# Patient Record
Sex: Female | Born: 1935 | Race: White | Hispanic: No | Marital: Married | Smoking: Never smoker
Health system: Southern US, Community
[De-identification: ages and names within clinical notes are randomized; demographics above are authoritative.]

## PROBLEM LIST (undated history)

## (undated) DIAGNOSIS — H3530 Unspecified macular degeneration: Secondary | ICD-10-CM

## (undated) DIAGNOSIS — I10 Essential (primary) hypertension: Secondary | ICD-10-CM

## (undated) HISTORY — PX: APPENDECTOMY: SHX54

## (undated) HISTORY — PX: BACK SURGERY: SHX140

## (undated) HISTORY — PX: TONSILLECTOMY: SUR1361

---

## 1997-07-21 ENCOUNTER — Other Ambulatory Visit: Admission: RE | Admit: 1997-07-21 | Discharge: 1997-07-21 | Payer: Self-pay | Admitting: Obstetrics and Gynecology

## 1998-02-09 ENCOUNTER — Other Ambulatory Visit: Admission: RE | Admit: 1998-02-09 | Discharge: 1998-02-09 | Payer: Self-pay | Admitting: Obstetrics and Gynecology

## 1998-10-13 ENCOUNTER — Other Ambulatory Visit: Admission: RE | Admit: 1998-10-13 | Discharge: 1998-10-13 | Payer: Self-pay | Admitting: Obstetrics & Gynecology

## 1999-08-17 ENCOUNTER — Encounter: Payer: Self-pay | Admitting: Orthopedic Surgery

## 1999-08-17 ENCOUNTER — Encounter: Admission: RE | Admit: 1999-08-17 | Discharge: 1999-08-17 | Payer: Self-pay | Admitting: Orthopedic Surgery

## 1999-10-17 ENCOUNTER — Encounter: Payer: Self-pay | Admitting: Orthopedic Surgery

## 1999-10-17 ENCOUNTER — Encounter: Admission: RE | Admit: 1999-10-17 | Discharge: 1999-10-17 | Payer: Self-pay | Admitting: Orthopedic Surgery

## 1999-10-18 ENCOUNTER — Ambulatory Visit (HOSPITAL_BASED_OUTPATIENT_CLINIC_OR_DEPARTMENT_OTHER): Admission: RE | Admit: 1999-10-18 | Discharge: 1999-10-18 | Payer: Self-pay | Admitting: Orthopedic Surgery

## 2000-11-07 ENCOUNTER — Ambulatory Visit (HOSPITAL_COMMUNITY): Admission: RE | Admit: 2000-11-07 | Discharge: 2000-11-07 | Payer: Self-pay | Admitting: Family Medicine

## 2000-11-07 ENCOUNTER — Encounter: Payer: Self-pay | Admitting: Family Medicine

## 2000-11-30 ENCOUNTER — Other Ambulatory Visit: Admission: RE | Admit: 2000-11-30 | Discharge: 2000-11-30 | Payer: Self-pay | Admitting: Family Medicine

## 2000-12-13 ENCOUNTER — Encounter: Admission: RE | Admit: 2000-12-13 | Discharge: 2000-12-13 | Payer: Self-pay | Admitting: Family Medicine

## 2000-12-13 ENCOUNTER — Encounter: Payer: Self-pay | Admitting: Family Medicine

## 2001-11-13 ENCOUNTER — Ambulatory Visit (HOSPITAL_COMMUNITY): Admission: RE | Admit: 2001-11-13 | Discharge: 2001-11-13 | Payer: Self-pay | Admitting: Family Medicine

## 2001-11-13 ENCOUNTER — Encounter: Payer: Self-pay | Admitting: Family Medicine

## 2002-01-27 ENCOUNTER — Other Ambulatory Visit: Admission: RE | Admit: 2002-01-27 | Discharge: 2002-01-27 | Payer: Self-pay | Admitting: Family Medicine

## 2002-01-28 ENCOUNTER — Ambulatory Visit: Admission: RE | Admit: 2002-01-28 | Discharge: 2002-01-28 | Payer: Self-pay | Admitting: Family Medicine

## 2003-01-13 ENCOUNTER — Ambulatory Visit (HOSPITAL_COMMUNITY): Admission: RE | Admit: 2003-01-13 | Discharge: 2003-01-13 | Payer: Self-pay | Admitting: Family Medicine

## 2003-06-29 ENCOUNTER — Ambulatory Visit (HOSPITAL_COMMUNITY): Admission: RE | Admit: 2003-06-29 | Discharge: 2003-06-29 | Payer: Self-pay | Admitting: Gastroenterology

## 2004-01-20 ENCOUNTER — Ambulatory Visit (HOSPITAL_COMMUNITY): Admission: RE | Admit: 2004-01-20 | Discharge: 2004-01-20 | Payer: Self-pay | Admitting: Internal Medicine

## 2004-05-27 ENCOUNTER — Ambulatory Visit (HOSPITAL_COMMUNITY): Admission: RE | Admit: 2004-05-27 | Discharge: 2004-05-27 | Payer: Self-pay | Admitting: Obstetrics and Gynecology

## 2004-07-19 ENCOUNTER — Other Ambulatory Visit: Admission: RE | Admit: 2004-07-19 | Discharge: 2004-07-19 | Payer: Self-pay | Admitting: Internal Medicine

## 2004-09-05 ENCOUNTER — Encounter: Admission: RE | Admit: 2004-09-05 | Discharge: 2004-09-05 | Payer: Self-pay | Admitting: Internal Medicine

## 2005-03-02 ENCOUNTER — Ambulatory Visit (HOSPITAL_COMMUNITY): Admission: RE | Admit: 2005-03-02 | Discharge: 2005-03-02 | Payer: Self-pay | Admitting: Internal Medicine

## 2005-05-11 ENCOUNTER — Encounter: Admission: RE | Admit: 2005-05-11 | Discharge: 2005-05-11 | Payer: Self-pay | Admitting: Neurosurgery

## 2005-05-15 ENCOUNTER — Encounter: Admission: RE | Admit: 2005-05-15 | Discharge: 2005-05-15 | Payer: Self-pay | Admitting: Neurosurgery

## 2005-06-29 ENCOUNTER — Inpatient Hospital Stay (HOSPITAL_COMMUNITY): Admission: RE | Admit: 2005-06-29 | Discharge: 2005-07-04 | Payer: Self-pay | Admitting: Neurosurgery

## 2019-10-20 ENCOUNTER — Emergency Department (HOSPITAL_BASED_OUTPATIENT_CLINIC_OR_DEPARTMENT_OTHER)
Admission: EM | Admit: 2019-10-20 | Discharge: 2019-10-20 | Disposition: A | Discharge status: Home / Self Care | Payer: Medicare Other | Attending: Emergency Medicine | Admitting: Emergency Medicine

## 2019-10-20 ENCOUNTER — Other Ambulatory Visit: Payer: Self-pay

## 2019-10-20 ENCOUNTER — Encounter (HOSPITAL_BASED_OUTPATIENT_CLINIC_OR_DEPARTMENT_OTHER): Payer: Self-pay | Admitting: *Deleted

## 2019-10-20 ENCOUNTER — Emergency Department (HOSPITAL_BASED_OUTPATIENT_CLINIC_OR_DEPARTMENT_OTHER): Payer: Medicare Other

## 2019-10-20 DIAGNOSIS — Y9289 Other specified places as the place of occurrence of the external cause: Secondary | ICD-10-CM | POA: Insufficient documentation

## 2019-10-20 DIAGNOSIS — M25512 Pain in left shoulder: Secondary | ICD-10-CM | POA: Insufficient documentation

## 2019-10-20 DIAGNOSIS — Z79899 Other long term (current) drug therapy: Secondary | ICD-10-CM | POA: Insufficient documentation

## 2019-10-20 DIAGNOSIS — W19XXXA Unspecified fall, initial encounter: Secondary | ICD-10-CM

## 2019-10-20 DIAGNOSIS — Y9389 Activity, other specified: Secondary | ICD-10-CM | POA: Diagnosis not present

## 2019-10-20 DIAGNOSIS — W108XXA Fall (on) (from) other stairs and steps, initial encounter: Secondary | ICD-10-CM | POA: Insufficient documentation

## 2019-10-20 DIAGNOSIS — I10 Essential (primary) hypertension: Secondary | ICD-10-CM | POA: Diagnosis not present

## 2019-10-20 DIAGNOSIS — Y998 Other external cause status: Secondary | ICD-10-CM | POA: Diagnosis not present

## 2019-10-20 HISTORY — DX: Unspecified macular degeneration: H35.30

## 2019-10-20 HISTORY — DX: Essential (primary) hypertension: I10

## 2019-10-20 NOTE — Discharge Instructions (Addendum)
Recommend you take Tylenol for pain as needed.  Take as written on the bottle.  You can apply ice today and then switch to heat tomorrow as needed for pain.  Clean the abrasion with soap and water daily.

## 2019-10-20 NOTE — ED Triage Notes (Signed)
She fell. Injury to her left shoulder.

## 2019-10-20 NOTE — ED Provider Notes (Signed)
[facility] EMERGENCY DEPARTMENT Provider Note   CSN: [id number] Arrival date & time: 10/20/19  1407     History Chief Complaint  Patient presents with  . Fall    Tiffany Lang is a 86 y.o. female past medical history of hypertension and macular degeneration.  Patient is not anticoagulated.  HPI Patient states she was walking down the stairs when she missed the last step causing her to hit her left shoulder on the wall.  She states this happened just prior to arrival.  She denies hitting her head or loss of consciousness.  She is reporting sharp aching pain in her left shoulder.  Pain is worse with movement.  Pain radiates to the left side of her neck.  She has not taken any medications for symptoms prior to arrival.  She rates the pain 5 out of 10 in severity. She denies any headache, visual changes, extremity swelling, numbness, weakness, tingling. Tetanus is up to date.       Past Medical History:  Diagnosis Date  . Hypertension   . Macular degeneration     There are no problems to display for this patient.   Past Surgical History:  Procedure Laterality Date  . APPENDECTOMY    . BACK SURGERY    . TONSILLECTOMY       OB History   No obstetric history on file.     No family history on file.  Social History   Tobacco Use  . Smoking status: Never Smoker  . Smokeless tobacco: Never Used  Substance Use Topics  . Alcohol use: Yes  . Drug use: Never    Home Medications Prior to Admission medications   Medication Sig Start Date End Date Taking? Authorizing Provider  amLODipine (NORVASC) 10 MG tablet Take 10 mg by mouth daily. 10/01/19   [provider]  hydrochlorothiazide (HYDRODIURIL) 25 MG tablet Take 25 mg by mouth daily. 10/01/19   [provider]    Allergies    Celecoxib, Penicillin g, and Sulfamethoxazole-trimethoprim  Review of Systems   Review of Systems All other systems are reviewed and are negative for acute change  except as noted in the HPI.  Physical Exam Updated Vital Signs BP (!) 173/74 (BP Location: Right Arm)   Pulse 80   Temp 98 F (36.7 C) (Oral)   Resp 19   Ht 5\' 3"  (1.6 m)   Wt 55.3 kg   SpO2 98%   BMI 21.61 kg/m   Physical Exam Vitals and nursing note reviewed.  Constitutional:      Appearance: She is well-developed. She is not ill-appearing or toxic-appearing.  HENT:     Head: Normocephalic and atraumatic. No raccoon eyes or Battle's sign.     Jaw: There is normal jaw occlusion.     Comments: No tenderness to palpation of skull. No deformities or crepitus noted. No open wounds, abrasions or lacerations.    Right Ear: Tympanic membrane and external ear normal. No hemotympanum.     Left Ear: Tympanic membrane and external ear normal. No hemotympanum.     Nose: Nose normal.     Right Nostril: No septal hematoma.     Left Nostril: No septal hematoma.     Mouth/Throat:     Mouth: No injury.  Eyes:     General: No scleral icterus.       Right eye: No discharge.        Left eye: No discharge.     Conjunctiva/sclera: Conjunctivae  normal.  Neck:     Vascular: No JVD.     Comments: Full ROM intact without spinous process TTP. No bony stepoffs or deformities. Mild tenderness to palpation of paraspinal muscles of cervical spine. No rigidity or meningeal signs. No bruising, erythema, or swelling.  Cardiovascular:     Rate and Rhythm: Normal rate and regular rhythm.     Pulses: Normal pulses.     Heart sounds: Normal heart sounds.  Pulmonary:     Effort: Pulmonary effort is normal.     Breath sounds: Normal breath sounds.  Abdominal:     General: There is no distension.  Musculoskeletal:        General: Normal range of motion.     Right shoulder: No bony tenderness.     Left shoulder: Bony tenderness present. No swelling, deformity, laceration or crepitus. Normal range of motion.     Left elbow: Normal.     Left forearm: Normal.     Left wrist: Normal.     Cervical back:  Normal range of motion.  Skin:    General: Skin is warm and dry.  Neurological:     Mental Status: She is oriented to person, place, and time.     GCS: GCS eye subscore is 4. GCS verbal subscore is 5. GCS motor subscore is 6.     Comments: Fluent speech, no facial droop.  Psychiatric:        Behavior: Behavior normal.     ED Results / Procedures / Treatments   Labs (all labs ordered are listed, but only abnormal results are displayed) Labs Reviewed - No data to display  EKG None  Radiology DG Shoulder Left  Result Date: 10/20/2019 CLINICAL DATA:  Left shoulder pain following a fall this morning. EXAM: LEFT SHOULDER - 2+ VIEW COMPARISON:  None. FINDINGS: Old, healed left rib fractures. No acute fracture or dislocation. Cervical and thoracic spine degenerative changes. IMPRESSION: No acute fracture or dislocation. Electronically Signed   By: Beckie Salts M.D.   On: 10/20/2019 15:01    Procedures Procedures (including critical care time)  Medications Ordered in ED Medications - No data to display  ED Course  I have reviewed the triage vital signs and the nursing notes.  Pertinent labs & imaging results that were available during my care of the patient were reviewed by me and considered in my medical decision making (see chart for details).    MDM Rules/Calculators/A&P                          History provided by patient with additional history obtained from chart review.   86 year old female presents after mechanical fall.  She is very well-appearing with no obvious deformity to left shoulder.  She has full range of motion of left shoulder.  Left upper extremity is neurovascularly intact.  She does have mild tenderness palpation of paraspinal muscles of cervical spine.  No midline cervical tenderness, no step-offs, deformity or crepitus.   On exam she has no signs of serious head neck or back injury.  She ambulates with steady gait.  X-ray of left shoulder viewed by me shows  no fracture dislocation.  Does show that she had some old left healed rib fractures.  On exam she has no tenderness palpation of chest.  No findings to suggest new occult rib fractures.  Patient denies need for any analgesics.  She plans to take Tylenol at home for pain.  The patient appears reasonably screened and/or stabilized for discharge and I doubt any other medical condition or other [facility] requiring further screening, evaluation, or treatment in the ED at this time prior to discharge. The patient is safe for discharge with strict return precautions discussed. Recommend pcp follow up if pain persists. The patient was discussed with and seen by Dr. Stevie Kern who agrees with the treatment plan.  Portions of this note were generated with Scientist, clinical (histocompatibility and immunogenetics). Dictation errors may occur despite best attempts at proofreading.   Final Clinical Impression(s) / ED Diagnoses Final diagnoses:  Fall, initial encounter    Rx / DC Orders ED Discharge Orders    None       Kathyrn Lass 10/20/19 1856    Milagros Loll, MD 10/24/19 1440

## 2022-05-07 IMAGING — CR DG SHOULDER 2+V*L*
4 series · 4 of 4 positions shown · non-contrast
Comparison: None.

CLINICAL DATA: Left shoulder pain following a fall this morning.

EXAM:
LEFT SHOULDER - 2+ VIEW

[w shoulder grashey left]
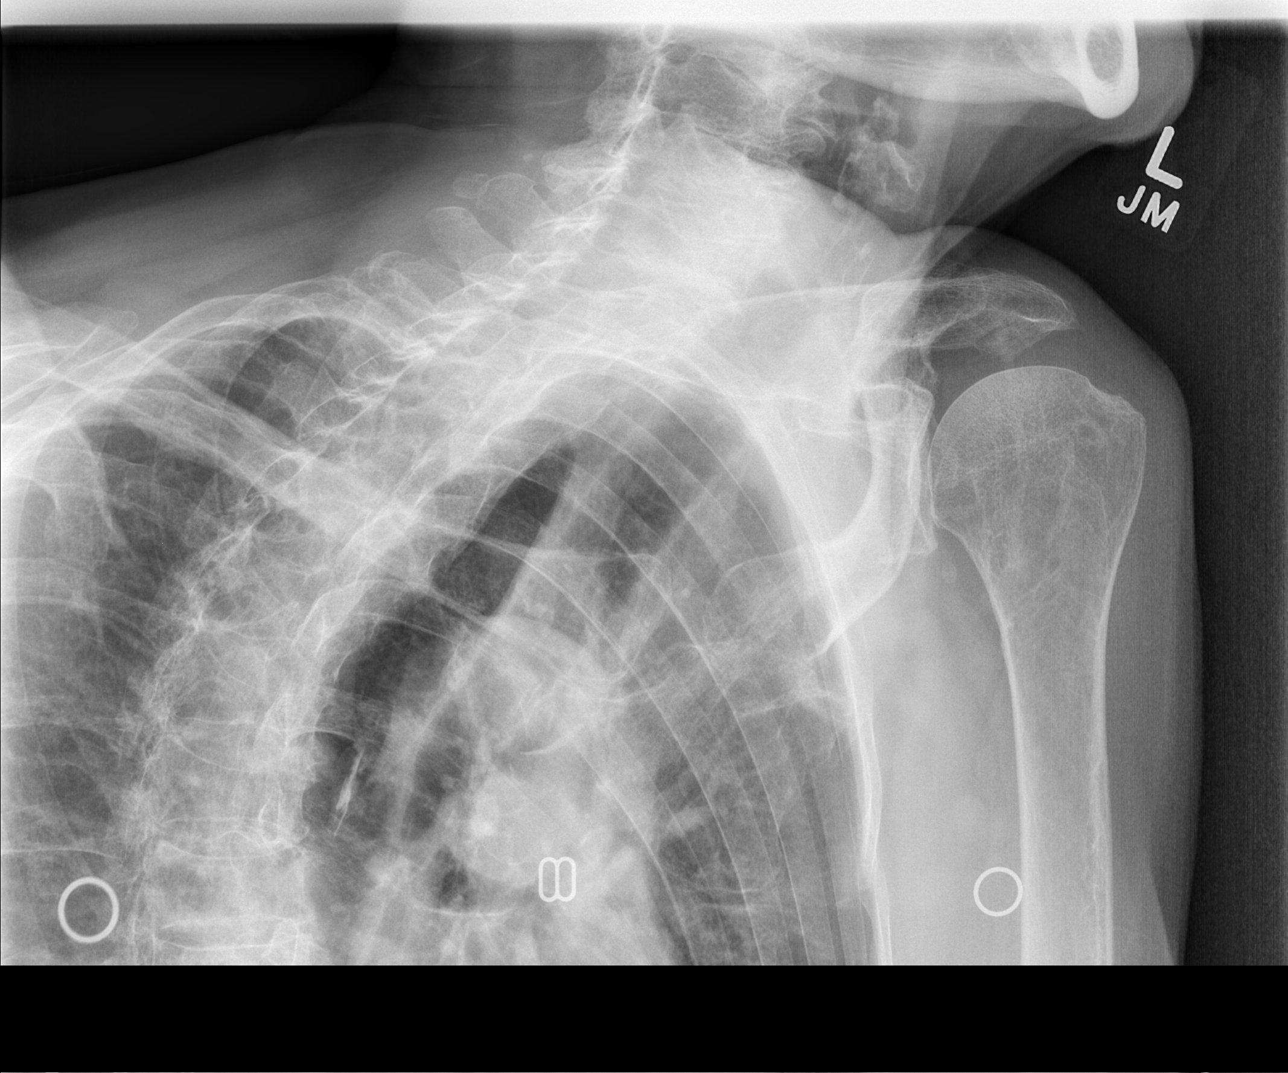

[w shoulder y view left]
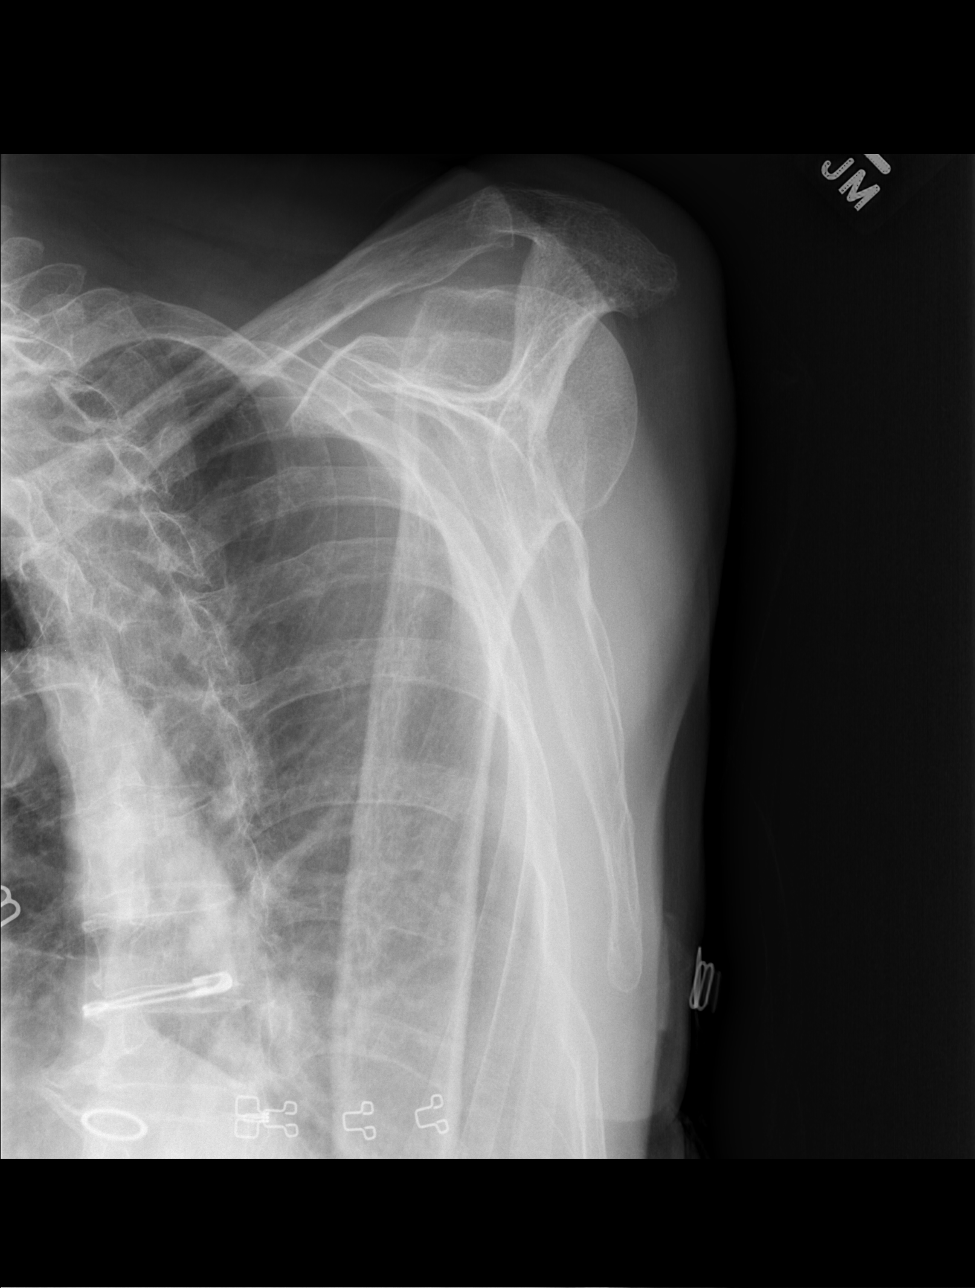

[w shoulder ap internal left]
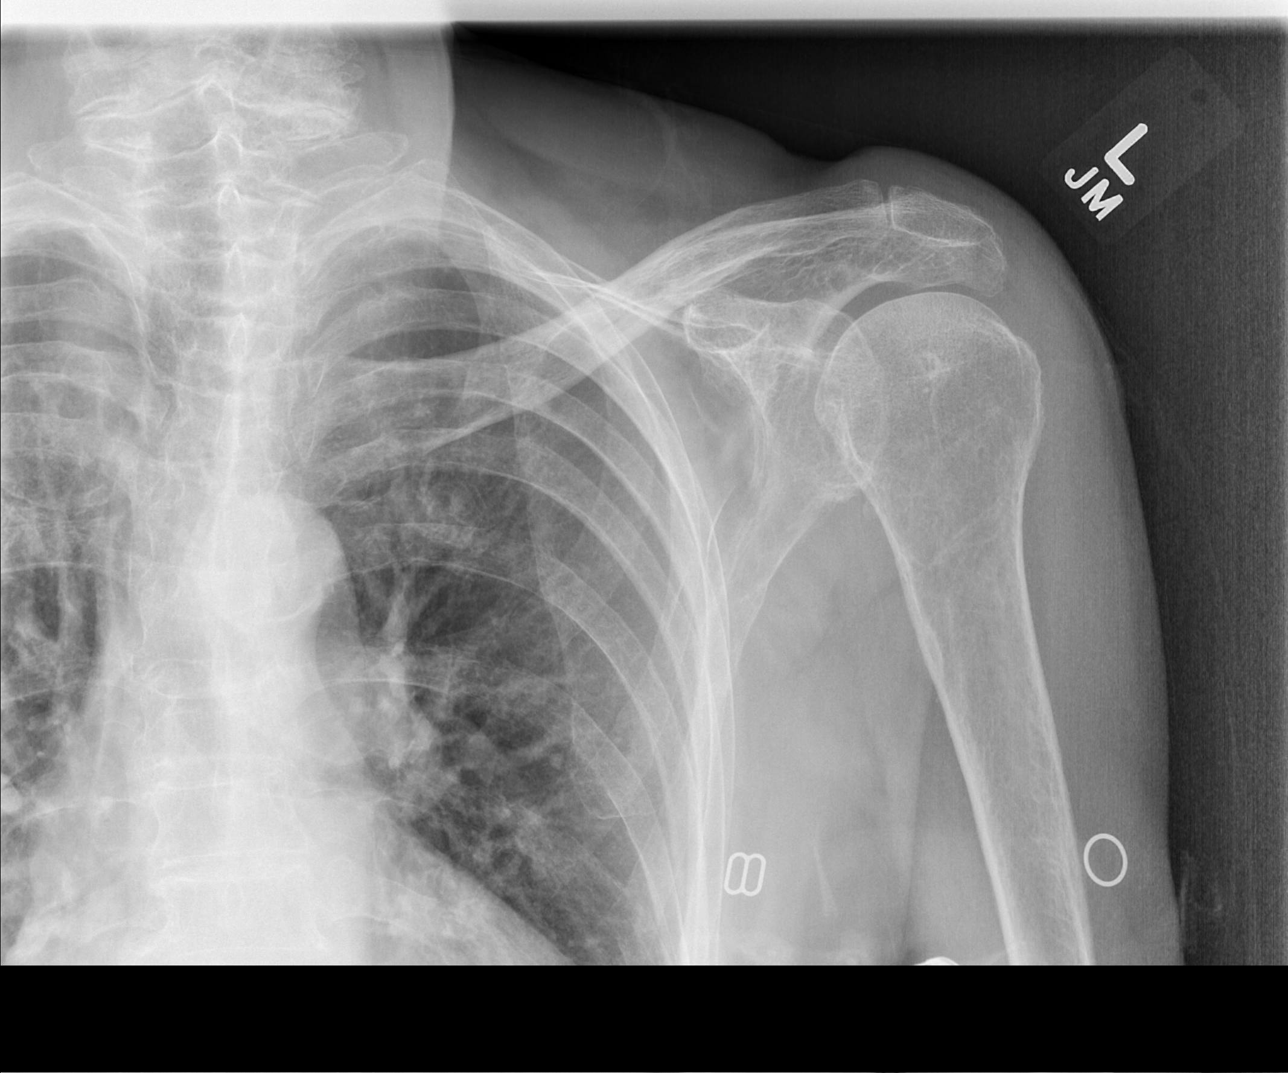

[w shoulder axillary left *]
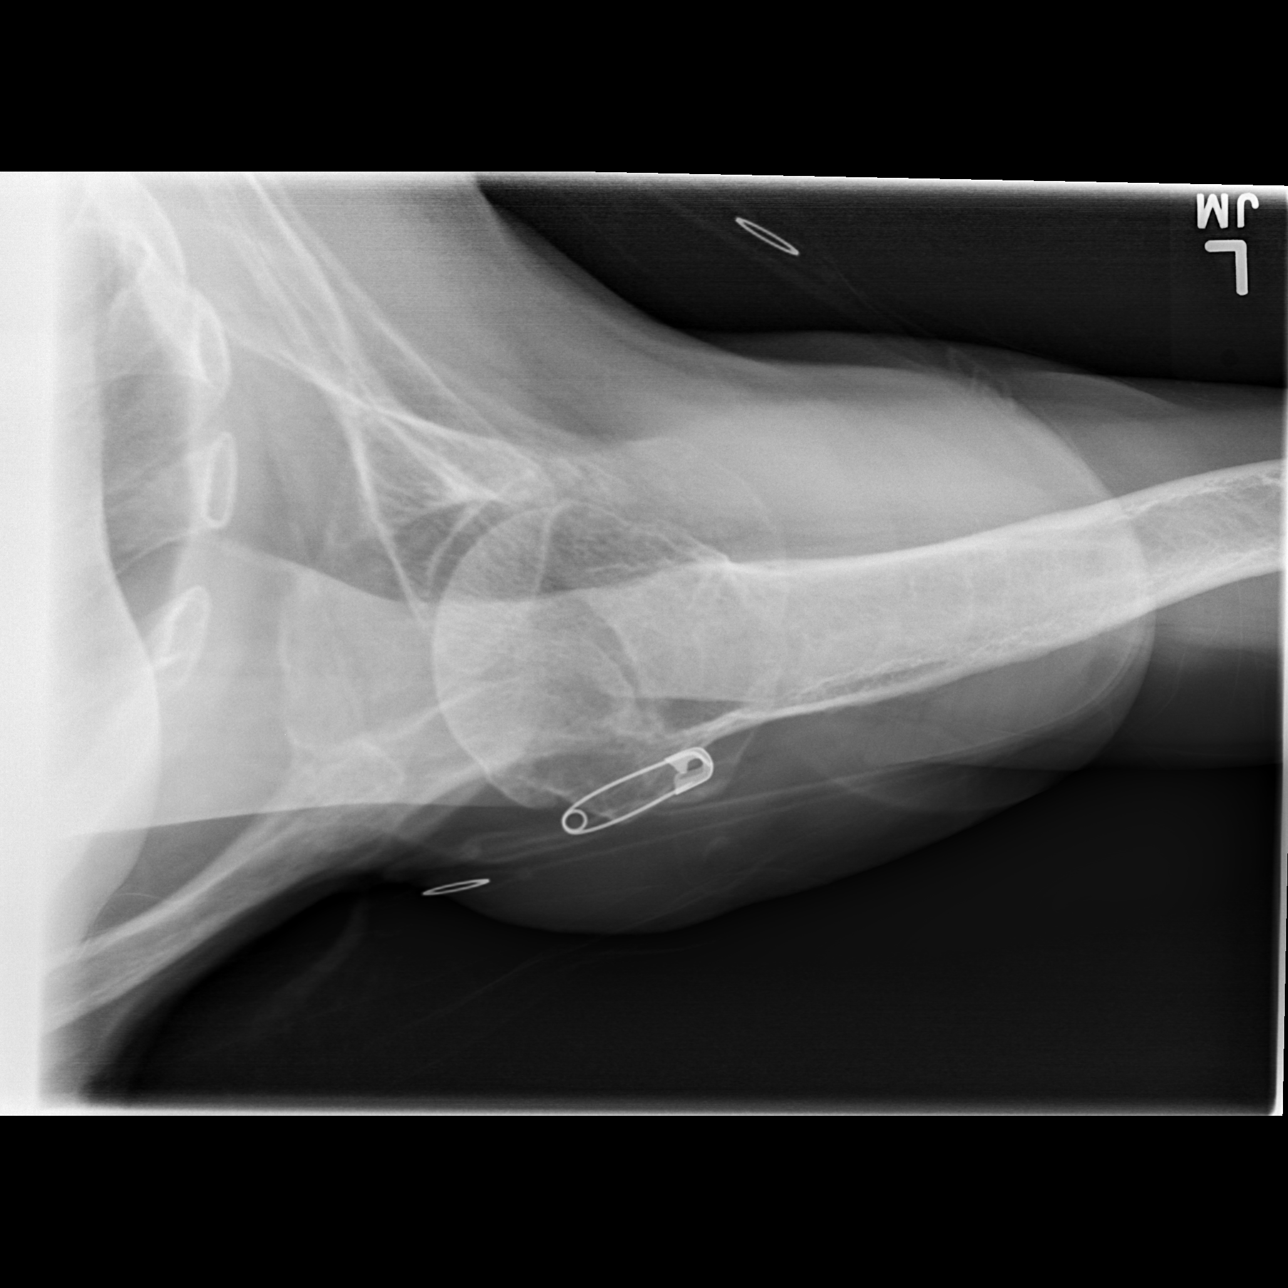

[4 of 4 positions shown; findings below may reference images not displayed]

FINDINGS: Old, healed left rib fractures. No acute fracture or dislocation.
Cervical and thoracic spine degenerative changes.
IMPRESSION: No acute fracture or dislocation.
